# Patient Record
Sex: Female | Born: 1945 | Race: White | Hispanic: No | Marital: Married | Smoking: Never smoker
Health system: Southern US, Community
[De-identification: ages and names within clinical notes are randomized; demographics above are authoritative.]

## PROBLEM LIST (undated history)

## (undated) DIAGNOSIS — C801 Malignant (primary) neoplasm, unspecified: Secondary | ICD-10-CM

## (undated) HISTORY — PX: BREAST LUMPECTOMY: SHX2

## (undated) HISTORY — PX: TONSILLECTOMY: SUR1361

---

## 2013-10-26 ENCOUNTER — Encounter (HOSPITAL_COMMUNITY)
Admission: EM | Disposition: A | Discharge status: Home / Self Care | Payer: Self-pay | Source: Home / Self Care | Attending: Orthopaedic Surgery

## 2013-10-26 ENCOUNTER — Observation Stay (HOSPITAL_COMMUNITY)
Admission: EM | Admit: 2013-10-26 | Discharge: 2013-10-28 | Disposition: A | Discharge status: Home / Self Care | Payer: Medicare Other | Attending: Orthopaedic Surgery | Admitting: Orthopaedic Surgery

## 2013-10-26 ENCOUNTER — Emergency Department (HOSPITAL_COMMUNITY): Payer: Medicare Other

## 2013-10-26 ENCOUNTER — Inpatient Hospital Stay (HOSPITAL_COMMUNITY): Payer: Medicare Other

## 2013-10-26 ENCOUNTER — Encounter (HOSPITAL_COMMUNITY): Payer: Medicare Other | Admitting: Certified Registered Nurse Anesthetist

## 2013-10-26 ENCOUNTER — Encounter (HOSPITAL_COMMUNITY): Payer: Self-pay | Admitting: Emergency Medicine

## 2013-10-26 ENCOUNTER — Inpatient Hospital Stay (HOSPITAL_COMMUNITY): Payer: Medicare Other | Admitting: Certified Registered Nurse Anesthetist

## 2013-10-26 DIAGNOSIS — S92309A Fracture of unspecified metatarsal bone(s), unspecified foot, initial encounter for closed fracture: Secondary | ICD-10-CM

## 2013-10-26 DIAGNOSIS — S93324A Dislocation of tarsometatarsal joint of right foot, initial encounter: Secondary | ICD-10-CM

## 2013-10-26 DIAGNOSIS — S93326A Dislocation of tarsometatarsal joint of unspecified foot, initial encounter: Secondary | ICD-10-CM | POA: Diagnosis present

## 2013-10-26 DIAGNOSIS — M79671 Pain in right foot: Secondary | ICD-10-CM | POA: Diagnosis not present

## 2013-10-26 DIAGNOSIS — S92321A Displaced fracture of second metatarsal bone, right foot, initial encounter for closed fracture: Secondary | ICD-10-CM | POA: Insufficient documentation

## 2013-10-26 DIAGNOSIS — S93336A Other dislocation of unspecified foot, initial encounter: Secondary | ICD-10-CM | POA: Diagnosis present

## 2013-10-26 DIAGNOSIS — Y92410 Unspecified street and highway as the place of occurrence of the external cause: Secondary | ICD-10-CM | POA: Diagnosis not present

## 2013-10-26 DIAGNOSIS — S92221A Displaced fracture of lateral cuneiform of right foot, initial encounter for closed fracture: Secondary | ICD-10-CM | POA: Insufficient documentation

## 2013-10-26 DIAGNOSIS — S92311A Displaced fracture of first metatarsal bone, right foot, initial encounter for closed fracture: Principal | ICD-10-CM | POA: Insufficient documentation

## 2013-10-26 HISTORY — PX: ORIF FOOT FRACTURE: SHX2123

## 2013-10-26 HISTORY — DX: Malignant (primary) neoplasm, unspecified: C80.1

## 2013-10-26 HISTORY — PX: OPEN REDUCTION INTERNAL FIXATION (ORIF) FOOT LISFRANC FRACTURE: SHX5990

## 2013-10-26 LAB — CBC WITH DIFFERENTIAL/PLATELET
BASOS ABS: 0 10*3/uL (ref 0.0–0.1)
Basophils Relative: 0 % (ref 0–1)
EOS ABS: 0 10*3/uL (ref 0.0–0.7)
Eosinophils Relative: 0 % (ref 0–5)
HEMATOCRIT: 39.6 % (ref 36.0–46.0)
Hemoglobin: 13.5 g/dL (ref 12.0–15.0)
LYMPHS PCT: 5 % — AB (ref 12–46)
Lymphs Abs: 0.5 10*3/uL — ABNORMAL LOW (ref 0.7–4.0)
MCH: 29.9 pg (ref 26.0–34.0)
MCHC: 34.1 g/dL (ref 30.0–36.0)
MCV: 87.8 fL (ref 78.0–100.0)
Monocytes Absolute: 0.3 10*3/uL (ref 0.1–1.0)
Monocytes Relative: 3 % (ref 3–12)
NEUTROS ABS: 8.8 10*3/uL — AB (ref 1.7–7.7)
Neutrophils Relative %: 92 % — ABNORMAL HIGH (ref 43–77)
PLATELETS: 218 10*3/uL (ref 150–400)
RBC: 4.51 MIL/uL (ref 3.87–5.11)
RDW: 13.7 % (ref 11.5–15.5)
WBC: 9.6 10*3/uL (ref 4.0–10.5)

## 2013-10-26 LAB — URINE MICROSCOPIC-ADD ON

## 2013-10-26 LAB — BASIC METABOLIC PANEL
ANION GAP: 13 (ref 5–15)
BUN: 22 mg/dL (ref 6–23)
CALCIUM: 9.2 mg/dL (ref 8.4–10.5)
CO2: 24 mEq/L (ref 19–32)
Chloride: 100 mEq/L (ref 96–112)
Creatinine, Ser: 0.62 mg/dL (ref 0.50–1.10)
GFR calc non Af Amer: >90 mL/min (ref >90)
Glucose, Bld: 114 mg/dL — ABNORMAL HIGH (ref 70–99)
Potassium: 4.3 mEq/L (ref 3.7–5.3)
SODIUM: 137 meq/L (ref 137–147)

## 2013-10-26 LAB — URINALYSIS, ROUTINE W REFLEX MICROSCOPIC
Bilirubin Urine: NEGATIVE
Glucose, UA: NEGATIVE mg/dL
KETONES UR: NEGATIVE mg/dL
Leukocytes, UA: NEGATIVE
NITRITE: NEGATIVE
PROTEIN: NEGATIVE mg/dL
Specific Gravity, Urine: 1.014 (ref 1.005–1.030)
UROBILINOGEN UA: 0.2 mg/dL (ref 0.0–1.0)
pH: 7 (ref 5.0–8.0)

## 2013-10-26 LAB — SURGICAL PCR SCREEN
MRSA, PCR: NEGATIVE
STAPHYLOCOCCUS AUREUS: NEGATIVE

## 2013-10-26 SURGERY — OPEN REDUCTION INTERNAL FIXATION (ORIF) FOOT LISFRANC FRACTURE
Anesthesia: General | Site: Foot | Laterality: Right

## 2013-10-26 MED ORDER — MIDAZOLAM HCL 2 MG/2ML IJ SOLN: 1.0000 mg | INTRAMUSCULAR | Status: DC | PRN

## 2013-10-26 MED ORDER — SODIUM CHLORIDE 0.9 % IV SOLN
INTRAVENOUS | Status: AC
  Administered 2013-10-26: 07:00:00 via INTRAVENOUS

## 2013-10-26 MED ORDER — ONDANSETRON HCL 4 MG/2ML IJ SOLN
INTRAMUSCULAR | Status: DC | PRN
  Administered 2013-10-26: 4 mg via INTRAVENOUS

## 2013-10-26 MED ORDER — ONDANSETRON HCL 4 MG PO TABS: 4.0000 mg | ORAL_TABLET | Freq: Four times a day (QID) | ORAL | Status: DC | PRN

## 2013-10-26 MED ORDER — MORPHINE SULFATE 4 MG/ML IJ SOLN
4.0000 mg | Freq: Once | INTRAMUSCULAR | Status: AC
  Administered 2013-10-26: 4 mg via INTRAVENOUS
  Filled 2013-10-26: qty 1

## 2013-10-26 MED ORDER — SCOPOLAMINE 1 MG/3DAYS TD PT72
MEDICATED_PATCH | TRANSDERMAL | Status: DC | PRN
  Administered 2013-10-26: 1 via TRANSDERMAL

## 2013-10-26 MED ORDER — MORPHINE SULFATE 4 MG/ML IJ SOLN
4.0000 mg | INTRAMUSCULAR | Status: DC | PRN
  Administered 2013-10-26: 4 mg via INTRAVENOUS
  Filled 2013-10-26: qty 1

## 2013-10-26 MED ORDER — EPHEDRINE SULFATE 50 MG/ML IJ SOLN
INTRAMUSCULAR | Status: DC | PRN
  Administered 2013-10-26: 10 mg via INTRAVENOUS

## 2013-10-26 MED ORDER — BUPIVACAINE HCL 0.5 % IJ SOLN
INTRAMUSCULAR | Status: DC | PRN
  Administered 2013-10-26: 4 mL

## 2013-10-26 MED ORDER — ONDANSETRON HCL 4 MG/2ML IJ SOLN
INTRAMUSCULAR | Status: AC
  Filled 2013-10-26: qty 2

## 2013-10-26 MED ORDER — FENTANYL CITRATE 0.05 MG/ML IJ SOLN
INTRAMUSCULAR | Status: AC
  Filled 2013-10-26: qty 5

## 2013-10-26 MED ORDER — METOCLOPRAMIDE HCL 10 MG PO TABS: 5.0000 mg | ORAL_TABLET | Freq: Three times a day (TID) | ORAL | Status: DC | PRN

## 2013-10-26 MED ORDER — SENNOSIDES-DOCUSATE SODIUM 8.6-50 MG PO TABS: 1.0000 | ORAL_TABLET | Freq: Every evening | ORAL | Status: DC | PRN

## 2013-10-26 MED ORDER — HYDROMORPHONE HCL 1 MG/ML IJ SOLN: 0.5000 mg | INTRAMUSCULAR | Status: DC | PRN

## 2013-10-26 MED ORDER — DEXAMETHASONE SODIUM PHOSPHATE 10 MG/ML IJ SOLN
INTRAMUSCULAR | Status: AC
  Filled 2013-10-26: qty 2

## 2013-10-26 MED ORDER — MIDAZOLAM HCL 2 MG/2ML IJ SOLN
INTRAMUSCULAR | Status: AC
  Filled 2013-10-26: qty 2

## 2013-10-26 MED ORDER — METHOCARBAMOL 500 MG PO TABS
500.0000 mg | ORAL_TABLET | Freq: Four times a day (QID) | ORAL | Status: DC | PRN
Start: 2013-10-26 — End: 2013-10-28
  Administered 2013-10-27 – 2013-10-28 (×2): 500 mg via ORAL
  Filled 2013-10-26 (×2): qty 1

## 2013-10-26 MED ORDER — DEXAMETHASONE SODIUM PHOSPHATE 4 MG/ML IJ SOLN
INTRAMUSCULAR | Status: AC
  Filled 2013-10-26: qty 2

## 2013-10-26 MED ORDER — DEXAMETHASONE SODIUM PHOSPHATE 4 MG/ML IJ SOLN
INTRAMUSCULAR | Status: DC | PRN
  Administered 2013-10-26: 8 mg via INTRAVENOUS

## 2013-10-26 MED ORDER — METHOCARBAMOL 1000 MG/10ML IJ SOLN
500.0000 mg | Freq: Four times a day (QID) | INTRAVENOUS | Status: DC | PRN
  Filled 2013-10-26: qty 5

## 2013-10-26 MED ORDER — ZOLPIDEM TARTRATE 5 MG PO TABS: 5.0000 mg | ORAL_TABLET | Freq: Every evening | ORAL | Status: DC | PRN

## 2013-10-26 MED ORDER — OXYCODONE-ACETAMINOPHEN 5-325 MG PO TABS: 1.0000 | ORAL_TABLET | Freq: Once | ORAL | Status: DC

## 2013-10-26 MED ORDER — 0.9 % SODIUM CHLORIDE (POUR BTL) OPTIME
TOPICAL | Status: DC | PRN
  Administered 2013-10-26: 1000 mL

## 2013-10-26 MED ORDER — MIDAZOLAM HCL 5 MG/5ML IJ SOLN
INTRAMUSCULAR | Status: DC | PRN
  Administered 2013-10-26: 2 mg via INTRAVENOUS

## 2013-10-26 MED ORDER — LEVOTHYROXINE SODIUM 100 MCG IV SOLR
50.0000 ug | Freq: Once | INTRAVENOUS | Status: AC
  Administered 2013-10-26: 50 ug via INTRAVENOUS
  Filled 2013-10-26: qty 5

## 2013-10-26 MED ORDER — LACTATED RINGERS IV SOLN: INTRAVENOUS | Status: DC

## 2013-10-26 MED ORDER — FENTANYL CITRATE 0.05 MG/ML IJ SOLN
INTRAMUSCULAR | Status: DC | PRN
  Administered 2013-10-26: 100 ug via INTRAVENOUS

## 2013-10-26 MED ORDER — KCL IN DEXTROSE-NACL 20-5-0.45 MEQ/L-%-% IV SOLN
INTRAVENOUS | Status: DC
  Filled 2013-10-26 (×4): qty 1000

## 2013-10-26 MED ORDER — FLEET ENEMA 7-19 GM/118ML RE ENEM: 1.0000 | ENEMA | Freq: Once | RECTAL | Status: AC | PRN

## 2013-10-26 MED ORDER — BISACODYL 10 MG RE SUPP: 10.0000 mg | Freq: Every day | RECTAL | Status: DC | PRN

## 2013-10-26 MED ORDER — ASPIRIN EC 325 MG PO TBEC: 325.0000 mg | DELAYED_RELEASE_TABLET | Freq: Every day | ORAL | Status: AC

## 2013-10-26 MED ORDER — SCOPOLAMINE 1 MG/3DAYS TD PT72: 1.0000 | MEDICATED_PATCH | TRANSDERMAL | Status: DC

## 2013-10-26 MED ORDER — LIDOCAINE HCL (CARDIAC) 20 MG/ML IV SOLN
INTRAVENOUS | Status: DC | PRN
  Administered 2013-10-26: 80 mg via INTRAVENOUS

## 2013-10-26 MED ORDER — LACTATED RINGERS IV SOLN
INTRAVENOUS | Status: DC | PRN
Start: 2013-10-26 — End: 2013-10-26
  Administered 2013-10-26 (×2): via INTRAVENOUS

## 2013-10-26 MED ORDER — DIPHENHYDRAMINE HCL 12.5 MG/5ML PO ELIX
12.5000 mg | ORAL_SOLUTION | ORAL | Status: DC | PRN
  Filled 2013-10-26: qty 10

## 2013-10-26 MED ORDER — ONDANSETRON HCL 4 MG/2ML IJ SOLN
4.0000 mg | Freq: Three times a day (TID) | INTRAMUSCULAR | Status: DC | PRN
  Administered 2013-10-26: 4 mg via INTRAVENOUS
  Filled 2013-10-26: qty 2

## 2013-10-26 MED ORDER — SCOPOLAMINE 1 MG/3DAYS TD PT72
MEDICATED_PATCH | TRANSDERMAL | Status: AC
  Filled 2013-10-26: qty 1

## 2013-10-26 MED ORDER — PHENYLEPHRINE HCL 10 MG/ML IJ SOLN
INTRAMUSCULAR | Status: DC | PRN
  Administered 2013-10-26: 80 ug via INTRAVENOUS
  Administered 2013-10-26: 120 ug via INTRAVENOUS
  Administered 2013-10-26: 80 ug via INTRAVENOUS
  Administered 2013-10-26: 120 ug via INTRAVENOUS

## 2013-10-26 MED ORDER — OXYCODONE-ACETAMINOPHEN 5-325 MG PO TABS: 1.0000 | ORAL_TABLET | ORAL | Status: AC | PRN

## 2013-10-26 MED ORDER — ASPIRIN EC 325 MG PO TBEC
325.0000 mg | DELAYED_RELEASE_TABLET | Freq: Every day | ORAL | Status: DC
  Administered 2013-10-26 – 2013-10-28 (×3): 325 mg via ORAL
  Filled 2013-10-26 (×3): qty 1

## 2013-10-26 MED ORDER — METOCLOPRAMIDE HCL 5 MG/ML IJ SOLN: 5.0000 mg | Freq: Three times a day (TID) | INTRAMUSCULAR | Status: DC | PRN

## 2013-10-26 MED ORDER — ONDANSETRON HCL 4 MG/2ML IJ SOLN: 4.0000 mg | Freq: Four times a day (QID) | INTRAMUSCULAR | Status: DC | PRN

## 2013-10-26 MED ORDER — HYDROMORPHONE HCL 1 MG/ML IJ SOLN
0.2500 mg | INTRAMUSCULAR | Status: DC | PRN
  Administered 2013-10-26: 0.5 mg via INTRAVENOUS

## 2013-10-26 MED ORDER — OXYCODONE HCL 5 MG PO TABS: 5.0000 mg | ORAL_TABLET | Freq: Once | ORAL | Status: DC | PRN

## 2013-10-26 MED ORDER — CEFAZOLIN SODIUM-DEXTROSE 2-3 GM-% IV SOLR
INTRAVENOUS | Status: AC
  Filled 2013-10-26: qty 50

## 2013-10-26 MED ORDER — HYDROCODONE-ACETAMINOPHEN 5-325 MG PO TABS
1.0000 | ORAL_TABLET | ORAL | Status: DC | PRN
  Administered 2013-10-27: 1 via ORAL
  Administered 2013-10-27: 2 via ORAL
  Administered 2013-10-27 (×2): 1 via ORAL
  Administered 2013-10-28: 2 via ORAL
  Filled 2013-10-26: qty 2
  Filled 2013-10-26 (×2): qty 1
  Filled 2013-10-26: qty 2
  Filled 2013-10-26: qty 1
  Filled 2013-10-26: qty 2

## 2013-10-26 MED ORDER — LEVOTHYROXINE SODIUM 112 MCG PO TABS
56.0000 ug | ORAL_TABLET | Freq: Every day | ORAL | Status: DC
  Administered 2013-10-27 – 2013-10-28 (×2): 56 ug via ORAL
  Filled 2013-10-26 (×3): qty 0.5

## 2013-10-26 MED ORDER — ONDANSETRON HCL 4 MG/2ML IJ SOLN: 4.0000 mg | Freq: Once | INTRAMUSCULAR | Status: DC | PRN

## 2013-10-26 MED ORDER — CEFAZOLIN SODIUM-DEXTROSE 2-3 GM-% IV SOLR
INTRAVENOUS | Status: DC | PRN
  Administered 2013-10-26: 2 g via INTRAVENOUS

## 2013-10-26 MED ORDER — FENTANYL CITRATE 0.05 MG/ML IJ SOLN
50.0000 ug | INTRAMUSCULAR | Status: DC | PRN
Start: 2013-10-26 — End: 2013-10-26

## 2013-10-26 MED ORDER — DOCUSATE SODIUM 100 MG PO CAPS
100.0000 mg | ORAL_CAPSULE | Freq: Two times a day (BID) | ORAL | Status: DC
  Administered 2013-10-26 – 2013-10-28 (×4): 100 mg via ORAL
  Filled 2013-10-26 (×5): qty 1

## 2013-10-26 MED ORDER — HYDROMORPHONE HCL 1 MG/ML IJ SOLN
INTRAMUSCULAR | Status: AC
  Filled 2013-10-26: qty 1

## 2013-10-26 MED ORDER — OXYCODONE HCL 5 MG/5ML PO SOLN: 5.0000 mg | Freq: Once | ORAL | Status: DC | PRN

## 2013-10-26 MED ORDER — PROPOFOL 10 MG/ML IV BOLUS
INTRAVENOUS | Status: DC | PRN
  Administered 2013-10-26: 200 mg via INTRAVENOUS

## 2013-10-26 MED ORDER — OXYCODONE-ACETAMINOPHEN 5-325 MG PO TABS
1.0000 | ORAL_TABLET | ORAL | Status: DC | PRN
  Filled 2013-10-26: qty 2

## 2013-10-26 MED ORDER — DULOXETINE HCL 60 MG PO CPEP
60.0000 mg | ORAL_CAPSULE | Freq: Every day | ORAL | Status: DC
  Administered 2013-10-27 – 2013-10-28 (×2): 60 mg via ORAL
  Filled 2013-10-26 (×2): qty 1

## 2013-10-26 SURGICAL SUPPLY — 55 items
4.0x30mm cannulated screw ×6 IMPLANT
BANDAGE ELASTIC 4 VELCRO ST LF (GAUZE/BANDAGES/DRESSINGS) ×3 IMPLANT
BANDAGE ELASTIC 6 VELCRO ST LF (GAUZE/BANDAGES/DRESSINGS) ×3 IMPLANT
BANDAGE ESMARK 6X9 LF (GAUZE/BANDAGES/DRESSINGS) ×1 IMPLANT
BIT DRILL CANN 2.7X625 NONSTRL (BIT) ×3 IMPLANT
BNDG ESMARK 6X9 LF (GAUZE/BANDAGES/DRESSINGS) ×3
COVER MAYO STAND STRL (DRAPES) ×3 IMPLANT
COVER SURGICAL LIGHT HANDLE (MISCELLANEOUS) ×3 IMPLANT
CUFF TOURNIQUET SINGLE 34IN LL (TOURNIQUET CUFF) ×3 IMPLANT
CUFF TOURNIQUET SINGLE 44IN (TOURNIQUET CUFF) IMPLANT
DRAPE C-ARM 42X72 X-RAY (DRAPES) IMPLANT
DRAPE INCISE IOBAN 66X45 STRL (DRAPES) ×3 IMPLANT
DRAPE PROXIMA HALF (DRAPES) ×3 IMPLANT
DRAPE U-SHAPE 47X51 STRL (DRAPES) ×3 IMPLANT
DRSG PAD ABDOMINAL 8X10 ST (GAUZE/BANDAGES/DRESSINGS) ×3 IMPLANT
DURAPREP 26ML APPLICATOR (WOUND CARE) ×3 IMPLANT
ELECT REM PT RETURN 9FT ADLT (ELECTROSURGICAL) ×3
ELECTRODE REM PT RTRN 9FT ADLT (ELECTROSURGICAL) ×1 IMPLANT
GAUZE SPONGE 4X4 12PLY STRL (GAUZE/BANDAGES/DRESSINGS) ×3 IMPLANT
GAUZE XEROFORM 1X8 LF (GAUZE/BANDAGES/DRESSINGS) ×3 IMPLANT
GAUZE XEROFORM 5X9 LF (GAUZE/BANDAGES/DRESSINGS) ×3 IMPLANT
GLOVE BIOGEL PI IND STRL 7.5 (GLOVE) ×1 IMPLANT
GLOVE BIOGEL PI IND STRL 8 (GLOVE) ×1 IMPLANT
GLOVE BIOGEL PI INDICATOR 7.5 (GLOVE) ×2
GLOVE BIOGEL PI INDICATOR 8 (GLOVE) ×2
GLOVE ECLIPSE 7.0 STRL STRAW (GLOVE) ×3 IMPLANT
GLOVE ORTHO TXT STRL SZ7.5 (GLOVE) ×3 IMPLANT
GOWN STRL REUS W/ TWL LRG LVL3 (GOWN DISPOSABLE) ×2 IMPLANT
GOWN STRL REUS W/ TWL XL LVL3 (GOWN DISPOSABLE) ×1 IMPLANT
GOWN STRL REUS W/TWL LRG LVL3 (GOWN DISPOSABLE) ×4
GOWN STRL REUS W/TWL XL LVL3 (GOWN DISPOSABLE) ×2
GUIDEWARE NON THREAD 1.25X150 (WIRE) ×3
GUIDEWIRE NON THREAD 1.25X150 (WIRE) ×1 IMPLANT
KIT BASIN OR (CUSTOM PROCEDURE TRAY) ×3 IMPLANT
KIT ROOM TURNOVER OR (KITS) ×3 IMPLANT
MANIFOLD NEPTUNE II (INSTRUMENTS) ×3 IMPLANT
NS IRRIG 1000ML POUR BTL (IV SOLUTION) ×3 IMPLANT
PACK ORTHO EXTREMITY (CUSTOM PROCEDURE TRAY) ×3 IMPLANT
PAD ARMBOARD 7.5X6 YLW CONV (MISCELLANEOUS) ×6 IMPLANT
PAD CAST 4YDX4 CTTN HI CHSV (CAST SUPPLIES) ×1 IMPLANT
PADDING CAST COTTON 4X4 STRL (CAST SUPPLIES) ×2
PADDING CAST COTTON 6X4 STRL (CAST SUPPLIES) ×3 IMPLANT
SCREW CANN S THRD/26 4.0 (Screw) ×6 IMPLANT
SPONGE LAP 18X18 X RAY DECT (DISPOSABLE) ×3 IMPLANT
STAPLER VISISTAT 35W (STAPLE) IMPLANT
SUCTION FRAZIER TIP 10 FR DISP (SUCTIONS) ×3 IMPLANT
SUT ETHILON 3 0 PS 1 (SUTURE) ×6 IMPLANT
SUT VIC AB 2-0 CT1 27 (SUTURE) ×4
SUT VIC AB 2-0 CT1 TAPERPNT 27 (SUTURE) ×2 IMPLANT
TOWEL OR 17X24 6PK STRL BLUE (TOWEL DISPOSABLE) ×3 IMPLANT
TOWEL OR 17X26 10 PK STRL BLUE (TOWEL DISPOSABLE) ×3 IMPLANT
TUBE CONNECTING 12'X1/4 (SUCTIONS) ×1
TUBE CONNECTING 12X1/4 (SUCTIONS) ×2 IMPLANT
WATER STERILE IRR 1000ML POUR (IV SOLUTION) ×3 IMPLANT
YANKAUER SUCT BULB TIP NO VENT (SUCTIONS) ×3 IMPLANT

## 2013-10-26 NOTE — Anesthesia Preprocedure Evaluation (Signed)

## 2013-10-26 NOTE — ED Notes (Signed)
Dr. Roxanne Mins and EDPA in to see pt.

## 2013-10-26 NOTE — Brief Op Note (Cosign Needed)
10/26/2013  4:47 PM  PATIENT:  Jordan Hardy  68 y.o. female  PRE-OPERATIVE DIAGNOSIS:  right foot tarsal metatarsal fracture dislocation  POST-OPERATIVE DIAGNOSIS:  right foot tarsal metatarsal fracture dislocation  PROCEDURE:  Procedure(s): CLOSED REDUCTION PERCUTANEOUS PINNING FOOT LISFRANC FRACTURE (Right)  SURGEON:  Surgeon(s) and Role:    * Marybelle Killings, MD - Primary  PHYSICIAN ASSISTANT:   ASSISTANTS: none   ANESTHESIA:   general  EBL:  Total I/O In: 240 [P.O.:240] Out: -   BLOOD ADMINISTERED:none  DRAINS: none   LOCAL MEDICATIONS USED:  NONE  SPECIMEN:  No Specimen  DISPOSITION OF SPECIMEN:  N/A  COUNTS:  YES  TOURNIQUET:    DICTATION: .Note written in EPIC  PLAN OF CARE: Admit for overnight observation  PATIENT DISPOSITION:  PACU - hemodynamically stable.   Delay start of Pharmacological VTE agent (>24hrs) due to surgical blood loss or risk of bleeding: no

## 2013-10-26 NOTE — ED Notes (Signed)
PT TO XRAY

## 2013-10-26 NOTE — Progress Notes (Signed)
Orthopedic Tech Progress Note Patient Details:  Jordan Hardy 05/07/1945 [id number]  Ortho Devices Type of Ortho Device: CAM walker Ortho Device/Splint Location: rle Ortho Device/Splint Interventions: Ordered  As ordered by Dr. Hamilton Capri, Emylie Amster 10/26/2013, 5:50 PM

## 2013-10-26 NOTE — Discharge Instructions (Signed)
Elevate foot above heart at all times at rest.  Strict non weight bearing on foot.  Keep dressing dry and clean at all times.  Ice packs as needed for pain and swelling.  Aspirin daily to prevent blood clots.

## 2013-10-26 NOTE — ED Notes (Signed)
Ortho at [facility]
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
## 2013-10-26 NOTE — ED Notes (Signed)
Ortho tech paged  

## 2013-10-26 NOTE — Transfer of Care (Signed)
Immediate Anesthesia Transfer of Care Note  Patient: Jordan Hardy  Procedure(s) Performed: Procedure(s): RIGHT FOOT OPEN REDUCTION INTERNAL FIXATION RIGHT TARSO-METATARSAL JOINT (Right)  Patient Location: PACU  Anesthesia Type:General  Level of Consciousness: awake, alert , oriented and patient cooperative  Airway & Oxygen Therapy: Patient Spontanous Breathing and Patient connected to nasal cannula oxygen  Post-op Assessment: Report given to PACU RN and Post -op Vital signs reviewed and stable  Post vital signs: Reviewed and stable  Complications: No apparent anesthesia complications

## 2013-10-26 NOTE — Interval H&P Note (Signed)
History and Physical Interval Note:  10/26/2013 4:17 PM  Jordan Hardy  has presented today for surgery, with the diagnosis of right foot tarsal metatarsal fracture dislocation  The various methods of treatment have been discussed with the patient and family. After consideration of risks, benefits and other options for treatment, the patient has consented to  Procedure(s): CLOSED REDUCTION PERCUTANEOUS PINNING FOOT LISFRANC FRACTURE (Right) as a surgical intervention .  The patient's history has been reviewed, patient examined, no change in status, stable for surgery.  I have reviewed the patient's chart and labs.  Questions were answered to the patient's satisfaction.     Quatavious Rossa C
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
## 2013-10-27 ENCOUNTER — Encounter (HOSPITAL_COMMUNITY): Payer: Self-pay | Admitting: General Practice

## 2013-10-27 DIAGNOSIS — S92311A Displaced fracture of first metatarsal bone, right foot, initial encounter for closed fracture: Secondary | ICD-10-CM | POA: Diagnosis not present
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
## 2013-10-27 NOTE — Progress Notes (Signed)
UR completed 

## 2013-10-27 NOTE — Progress Notes (Signed)
Jordan Hardy due to surgical dressing

## 2013-10-27 NOTE — Progress Notes (Signed)
Radiating up anterior of foot
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
## 2013-10-27 NOTE — Progress Notes (Signed)
Subjective: 1 Day Post-Op Procedure(s) (LRB): RIGHT FOOT OPEN REDUCTION INTERNAL FIXATION RIGHT TARSO-METATARSAL JOINT (Right) Patient reports pain as mild.    Objective: Vital signs in last 24 hours: Temp:  [97.2 F (36.2 C)-99.1 F (37.3 C)] 98 F (36.7 C) (10/15 0510) Pulse Rate:  [75-160] 80 (10/15 0510) Resp:  [8-17] 15 (10/15 0510) BP: (128-176)/(55-93) 128/55 mmHg (10/15 0510) SpO2:  [95 %-100 %] 99 % (10/15 0510)  Intake/Output from previous day: 10/14 0701 - 10/15 0700 In: 2280 [P.O.:480; I.V.:1800] Out: 25 [Blood:25] Intake/Output this shift:     Recent Labs  10/26/13 0616  HGB 13.5    Recent Labs  10/26/13 0616  WBC 9.6  RBC 4.51  HCT 39.6  PLT 218    Recent Labs  10/26/13 0616  NA 137  K 4.3  CL 100  CO2 24  BUN 22  CREATININE 0.62  GLUCOSE 114*  CALCIUM 9.2   No results found for this basename: LABPT, INR,  in the last 72 hours  Neurologically intact  Assessment/Plan: 1 Day Post-Op Procedure(s) (LRB): RIGHT FOOT OPEN REDUCTION INTERNAL FIXATION RIGHT TARSO-METATARSAL JOINT (Right) Up with therapy  Home in AM  Martie Muhlbauer C 10/27/2013, 7:35 AM

## 2013-10-28 DIAGNOSIS — S92311A Displaced fracture of first metatarsal bone, right foot, initial encounter for closed fracture: Secondary | ICD-10-CM | POA: Diagnosis not present

## 2013-10-28 NOTE — Progress Notes (Signed)
Discharge instructions given. Pt verbalized understanding and all questions were answered.  

## 2013-10-28 NOTE — Anesthesia Postprocedure Evaluation (Signed)
Anesthesia Post Note  Patient: Jordan Hardy  Procedure(s) Performed: Procedure(s) (LRB): RIGHT FOOT OPEN REDUCTION INTERNAL FIXATION RIGHT TARSO-METATARSAL JOINT (Right)  Anesthesia type: General  Patient location: PACU  Post pain: Pain level controlled and Adequate analgesia  Post assessment: Post-op Vital signs reviewed, Patient's Cardiovascular Status Stable, Respiratory Function Stable, Patent Airway and Pain level controlled  Last Vitals:  Filed Vitals:   10/28/13 0543  BP: 118/70  Pulse: 68  Temp: 36.6 C  Resp: 16    Post vital signs: Reviewed and stable  Level of consciousness: awake, alert  and oriented  Complications: No apparent anesthesia complications

## 2013-11-01 ENCOUNTER — Encounter (HOSPITAL_COMMUNITY): Payer: Self-pay | Admitting: Orthopaedic Surgery
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
## 2013-11-03 ENCOUNTER — Encounter (HOSPITAL_COMMUNITY): Payer: Self-pay | Admitting: Orthopaedic Surgery
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
